# Patient Record
Sex: Female | Born: 1990 | Race: White | Hispanic: No | Smoking: Current every day smoker
Health system: Southern US, Community
[De-identification: ages and names within clinical notes are randomized; demographics above are authoritative.]

## PROBLEM LIST (undated history)

## (undated) ENCOUNTER — Emergency Department (HOSPITAL_COMMUNITY): Payer: Self-pay | Source: Home / Self Care

## (undated) DIAGNOSIS — E0590 Thyrotoxicosis, unspecified without thyrotoxic crisis or storm: Secondary | ICD-10-CM

## (undated) DIAGNOSIS — M549 Dorsalgia, unspecified: Secondary | ICD-10-CM

---
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
## 2008-01-11 ENCOUNTER — Emergency Department (HOSPITAL_COMMUNITY): Admission: EM | Admit: 2008-01-11 | Discharge: 2008-01-11 | Payer: Self-pay | Admitting: Emergency Medicine

## 2008-02-29 ENCOUNTER — Emergency Department (HOSPITAL_COMMUNITY): Admission: EM | Admit: 2008-02-29 | Discharge: 2008-02-29 | Payer: Self-pay | Admitting: Emergency Medicine

## 2008-04-02 ENCOUNTER — Ambulatory Visit (HOSPITAL_COMMUNITY): Admission: RE | Admit: 2008-04-02 | Discharge: 2008-04-02 | Payer: Self-pay | Admitting: *Deleted

## 2008-05-04 ENCOUNTER — Emergency Department (HOSPITAL_COMMUNITY): Admission: EM | Admit: 2008-05-04 | Discharge: 2008-05-04 | Payer: Self-pay | Admitting: Emergency Medicine

## 2008-05-06 ENCOUNTER — Emergency Department (HOSPITAL_COMMUNITY): Admission: EM | Admit: 2008-05-06 | Discharge: 2008-05-06 | Payer: Self-pay | Admitting: Emergency Medicine

## 2008-11-21 ENCOUNTER — Emergency Department (HOSPITAL_COMMUNITY): Admission: EM | Admit: 2008-11-21 | Discharge: 2008-11-21 | Payer: Self-pay | Admitting: Emergency Medicine

## 2010-06-20 ENCOUNTER — Encounter: Payer: Self-pay | Admitting: *Deleted

## 2010-07-04 ENCOUNTER — Emergency Department (HOSPITAL_COMMUNITY): Payer: Self-pay

## 2010-07-04 ENCOUNTER — Emergency Department (HOSPITAL_COMMUNITY)
Admission: EM | Admit: 2010-07-04 | Discharge: 2010-07-04 | Disposition: A | Discharge status: Home / Self Care | Payer: Self-pay | Attending: Emergency Medicine | Admitting: Emergency Medicine

## 2010-07-04 DIAGNOSIS — R079 Chest pain, unspecified: Secondary | ICD-10-CM | POA: Insufficient documentation

## 2010-07-04 DIAGNOSIS — R070 Pain in throat: Secondary | ICD-10-CM | POA: Insufficient documentation

## 2010-07-04 DIAGNOSIS — J020 Streptococcal pharyngitis: Secondary | ICD-10-CM | POA: Insufficient documentation

## 2010-07-04 LAB — RAPID STREP SCREEN (MED CTR MEBANE ONLY): Streptococcus, Group A Screen (Direct): POSITIVE — AB

## 2011-01-10 ENCOUNTER — Other Ambulatory Visit: Payer: Self-pay | Admitting: Obstetrics and Gynecology

## 2011-01-10 ENCOUNTER — Other Ambulatory Visit (HOSPITAL_COMMUNITY)
Admission: RE | Admit: 2011-01-10 | Discharge: 2011-01-10 | Disposition: A | Discharge status: Home / Self Care | Payer: Medicaid Other | Source: Ambulatory Visit | Attending: Obstetrics and Gynecology | Admitting: Obstetrics and Gynecology

## 2011-01-10 DIAGNOSIS — Z113 Encounter for screening for infections with a predominantly sexual mode of transmission: Secondary | ICD-10-CM | POA: Insufficient documentation

## 2011-01-10 DIAGNOSIS — Z01419 Encounter for gynecological examination (general) (routine) without abnormal findings: Secondary | ICD-10-CM | POA: Insufficient documentation

## 2011-01-10 LAB — HEPATITIS B SURFACE ANTIGEN: Hepatitis B Surface Ag: NEGATIVE

## 2011-01-10 LAB — ABO/RH: RH Type: POSITIVE

## 2011-01-10 LAB — ANTIBODY SCREEN: Antibody Screen: NEGATIVE

## 2011-01-10 LAB — RPR: RPR: NONREACTIVE

## 2011-01-10 LAB — HIV ANTIBODY (ROUTINE TESTING W REFLEX): HIV: NONREACTIVE

## 2011-02-27 LAB — URINALYSIS, ROUTINE W REFLEX MICROSCOPIC
Bilirubin Urine: NEGATIVE
Glucose, UA: NEGATIVE
Ketones, ur: NEGATIVE
Nitrite: POSITIVE — AB
Protein, ur: 30 — AB
Specific Gravity, Urine: 1.02
Urobilinogen, UA: 0.2
pH: 6

## 2011-02-27 LAB — URINE MICROSCOPIC-ADD ON

## 2011-02-27 LAB — PREGNANCY, URINE: Preg Test, Ur: NEGATIVE

## 2011-03-03 LAB — STREP A DNA PROBE: Group A Strep Probe: NEGATIVE

## 2011-03-03 LAB — RAPID STREP SCREEN (MED CTR MEBANE ONLY): Streptococcus, Group A Screen (Direct): NEGATIVE

## 2011-03-09 LAB — RAPID STREP SCREEN (MED CTR MEBANE ONLY): Streptococcus, Group A Screen (Direct): NEGATIVE

## 2011-03-09 LAB — STREP A DNA PROBE: Group A Strep Probe: NEGATIVE

## 2011-07-24 ENCOUNTER — Encounter (HOSPITAL_COMMUNITY): Payer: Self-pay

## 2011-07-28 ENCOUNTER — Encounter (HOSPITAL_COMMUNITY)
Admission: RE | Admit: 2011-07-28 | Discharge: 2011-07-28 | Disposition: A | Discharge status: Home / Self Care | Payer: Medicaid Other | Source: Ambulatory Visit | Attending: Obstetrics & Gynecology | Admitting: Obstetrics & Gynecology

## 2011-07-28 ENCOUNTER — Encounter (HOSPITAL_COMMUNITY): Payer: Self-pay

## 2011-07-28 LAB — CBC
HCT: 40.5 % (ref 36.0–46.0)
Hemoglobin: 14.3 g/dL (ref 12.0–15.0)
MCH: 31.2 pg (ref 26.0–34.0)
MCHC: 35.3 g/dL (ref 30.0–36.0)
MCV: 88.2 fL (ref 78.0–100.0)
Platelets: 185 10*3/uL (ref 150–400)
RBC: 4.59 MIL/uL (ref 3.87–5.11)
RDW: 13.6 % (ref 11.5–15.5)
WBC: 16.7 10*3/uL — ABNORMAL HIGH (ref 4.0–10.5)

## 2011-07-28 LAB — SURGICAL PCR SCREEN
MRSA, PCR: NEGATIVE
Staphylococcus aureus: NEGATIVE

## 2011-07-28 NOTE — Patient Instructions (Signed)
YOUR PROCEDURE IS SCHEDULED ON:08/02/11  ENTER THROUGH THE MAIN ENTRANCE OF [facility] AT: 3pm  USE DESK PHONE AND DIAL [phone] TO INFORM us OF YOUR ARRIVAL  CALL [phone] IF YOU HAVE ANY QUESTIONS OR PROBLEMS PRIOR TO YOUR ARRIVAL.  REMEMBER: DO NOT EAT  : after 8:30 am on Wed  SPECIAL INSTRUCTIONS:clear liquids until 2pm on Wed   YOU MAY BRUSH YOUR TEETH THE MORNING OF SURGERY   TAKE THESE MEDICINES THE DAY OF SURGERY WITH SIP OF WATER:none   DO NOT WEAR JEWELRY, EYE MAKEUP, LIPSTICK OR DARK FINGERNAIL POLISH DO NOT WEAR LOTIONS  DO NOT SHAVE FOR 48 HOURS PRIOR TO SURGERY  YOU WILL NOT BE ALLOWED TO DRIVE YOURSELF HOME.  NAME OF DRIVER:Jason - father of baby

## 2011-07-29 LAB — RPR: RPR Ser Ql: NONREACTIVE

## 2011-08-01 ENCOUNTER — Other Ambulatory Visit: Payer: Self-pay | Admitting: Obstetrics & Gynecology

## 2011-08-02 ENCOUNTER — Inpatient Hospital Stay (HOSPITAL_COMMUNITY)
Admission: RE | Admit: 2011-08-02 | Discharge: 2011-08-04 | DRG: 765 | Disposition: A | Discharge status: Home Health | Payer: Medicaid Other | Source: Ambulatory Visit | Attending: Obstetrics & Gynecology | Admitting: Obstetrics & Gynecology

## 2011-08-02 ENCOUNTER — Encounter (HOSPITAL_COMMUNITY)
Admission: RE | Disposition: A | Discharge status: Home Health | Payer: Self-pay | Source: Ambulatory Visit | Attending: Obstetrics & Gynecology

## 2011-08-02 ENCOUNTER — Inpatient Hospital Stay (HOSPITAL_COMMUNITY): Payer: Medicaid Other

## 2011-08-02 ENCOUNTER — Encounter (HOSPITAL_COMMUNITY): Payer: Self-pay

## 2011-08-02 ENCOUNTER — Encounter (HOSPITAL_COMMUNITY): Payer: Self-pay | Admitting: *Deleted

## 2011-08-02 DIAGNOSIS — O4100X0 Oligohydramnios, unspecified trimester, not applicable or unspecified: Secondary | ICD-10-CM | POA: Diagnosis present

## 2011-08-02 DIAGNOSIS — O365990 Maternal care for other known or suspected poor fetal growth, unspecified trimester, not applicable or unspecified: Secondary | ICD-10-CM | POA: Diagnosis present

## 2011-08-02 DIAGNOSIS — O321XX0 Maternal care for breech presentation, not applicable or unspecified: Principal | ICD-10-CM | POA: Diagnosis present

## 2011-08-02 LAB — COMPREHENSIVE METABOLIC PANEL
ALT: 16 U/L (ref 0–35)
AST: 14 U/L (ref 0–37)
Albumin: 3.1 g/dL — ABNORMAL LOW (ref 3.5–5.2)
Alkaline Phosphatase: 120 U/L — ABNORMAL HIGH (ref 39–117)
BUN: 6 mg/dL (ref 6–23)
CO2: 23 mEq/L (ref 19–32)
Calcium: 9.7 mg/dL (ref 8.4–10.5)
Chloride: 100 mEq/L (ref 96–112)
Creatinine, Ser: 0.71 mg/dL (ref 0.50–1.10)
GFR calc Af Amer: >90 mL/min (ref >90)
GFR calc non Af Amer: >90 mL/min (ref >90)
Glucose, Bld: 73 mg/dL (ref 70–99)
Potassium: 3.9 mEq/L (ref 3.5–5.1)
Sodium: 134 mEq/L — ABNORMAL LOW (ref 135–145)
Total Bilirubin: 0.5 mg/dL (ref 0.3–1.2)
Total Protein: 6.8 g/dL (ref 6.0–8.3)

## 2011-08-02 LAB — URINALYSIS, ROUTINE W REFLEX MICROSCOPIC
Bilirubin Urine: NEGATIVE
Glucose, UA: NEGATIVE mg/dL
Hgb urine dipstick: NEGATIVE
Ketones, ur: 15 mg/dL — AB
Nitrite: NEGATIVE
Protein, ur: NEGATIVE mg/dL
Specific Gravity, Urine: 1.015 (ref 1.005–1.030)
Urobilinogen, UA: 0.2 mg/dL (ref 0.0–1.0)
pH: 7 (ref 5.0–8.0)

## 2011-08-02 LAB — CBC
HCT: 40.7 % (ref 36.0–46.0)
Hemoglobin: 14.2 g/dL (ref 12.0–15.0)
MCH: 30.8 pg (ref 26.0–34.0)
MCHC: 34.9 g/dL (ref 30.0–36.0)
MCV: 88.3 fL (ref 78.0–100.0)
Platelets: 190 10*3/uL (ref 150–400)
RBC: 4.61 MIL/uL (ref 3.87–5.11)
RDW: 13.7 % (ref 11.5–15.5)
WBC: 15.1 10*3/uL — ABNORMAL HIGH (ref 4.0–10.5)

## 2011-08-02 LAB — URINE MICROSCOPIC-ADD ON

## 2011-08-02 SURGERY — Surgical Case
Anesthesia: Spinal | Site: Abdomen | Wound class: Clean Contaminated

## 2011-08-02 MED ORDER — ONDANSETRON HCL 4 MG/2ML IJ SOLN: 4.0000 mg | INTRAMUSCULAR | Status: DC | PRN

## 2011-08-02 MED ORDER — DIBUCAINE 1 % RE OINT: 1.0000 "application " | TOPICAL_OINTMENT | RECTAL | Status: DC | PRN

## 2011-08-02 MED ORDER — CEFAZOLIN SODIUM 1-5 GM-% IV SOLN: 1.0000 g | INTRAVENOUS | Status: DC

## 2011-08-02 MED ORDER — MENTHOL 3 MG MT LOZG: 1.0000 | LOZENGE | OROMUCOSAL | Status: DC | PRN

## 2011-08-02 MED ORDER — NALBUPHINE HCL 10 MG/ML IJ SOLN
5.0000 mg | INTRAMUSCULAR | Status: DC | PRN
  Filled 2011-08-02: qty 1

## 2011-08-02 MED ORDER — SODIUM CHLORIDE 0.9 % IJ SOLN: 3.0000 mL | INTRAMUSCULAR | Status: DC | PRN

## 2011-08-02 MED ORDER — IBUPROFEN 600 MG PO TABS
600.0000 mg | ORAL_TABLET | Freq: Four times a day (QID) | ORAL | Status: DC
  Administered 2011-08-03 – 2011-08-04 (×6): 600 mg via ORAL
  Filled 2011-08-02 (×6): qty 1

## 2011-08-02 MED ORDER — FENTANYL CITRATE 0.05 MG/ML IJ SOLN: 25.0000 ug | INTRAMUSCULAR | Status: DC | PRN

## 2011-08-02 MED ORDER — FENTANYL CITRATE 0.05 MG/ML IJ SOLN
INTRAMUSCULAR | Status: DC | PRN
  Administered 2011-08-02: 15 ug via EPIDURAL

## 2011-08-02 MED ORDER — DIPHENHYDRAMINE HCL 25 MG PO CAPS: 25.0000 mg | ORAL_CAPSULE | ORAL | Status: DC | PRN

## 2011-08-02 MED ORDER — SCOPOLAMINE 1 MG/3DAYS TD PT72: 1.0000 | MEDICATED_PATCH | Freq: Once | TRANSDERMAL | Status: DC

## 2011-08-02 MED ORDER — SODIUM CHLORIDE 0.9 % IV SOLN: 1.0000 ug/kg/h | INTRAVENOUS | Status: DC | PRN

## 2011-08-02 MED ORDER — KETOROLAC TROMETHAMINE 30 MG/ML IJ SOLN: 30.0000 mg | Freq: Four times a day (QID) | INTRAMUSCULAR | Status: AC | PRN

## 2011-08-02 MED ORDER — EPHEDRINE SULFATE 50 MG/ML IJ SOLN
INTRAMUSCULAR | Status: DC | PRN
  Administered 2011-08-02: 5 mg via INTRAVENOUS
  Administered 2011-08-02: 10 mg via INTRAVENOUS
  Administered 2011-08-02: 5 mg via INTRAVENOUS

## 2011-08-02 MED ORDER — DIPHENHYDRAMINE HCL 50 MG/ML IJ SOLN: 25.0000 mg | INTRAMUSCULAR | Status: DC | PRN

## 2011-08-02 MED ORDER — ONDANSETRON HCL 4 MG PO TABS: 4.0000 mg | ORAL_TABLET | ORAL | Status: DC | PRN

## 2011-08-02 MED ORDER — MEPERIDINE HCL 25 MG/ML IJ SOLN: 6.2500 mg | INTRAMUSCULAR | Status: DC | PRN

## 2011-08-02 MED ORDER — SIMETHICONE 80 MG PO CHEW: 80.0000 mg | CHEWABLE_TABLET | ORAL | Status: DC | PRN

## 2011-08-02 MED ORDER — LANOLIN HYDROUS EX OINT: 1.0000 "application " | TOPICAL_OINTMENT | CUTANEOUS | Status: DC | PRN

## 2011-08-02 MED ORDER — OXYTOCIN 10 UNIT/ML IJ SOLN
INTRAMUSCULAR | Status: DC | PRN
  Administered 2011-08-02: 20 [IU]

## 2011-08-02 MED ORDER — WITCH HAZEL-GLYCERIN EX PADS: 1.0000 "application " | MEDICATED_PAD | CUTANEOUS | Status: DC | PRN

## 2011-08-02 MED ORDER — ZOLPIDEM TARTRATE 5 MG PO TABS: 5.0000 mg | ORAL_TABLET | Freq: Every evening | ORAL | Status: DC | PRN

## 2011-08-02 MED ORDER — MORPHINE SULFATE (PF) 0.5 MG/ML IJ SOLN
INTRAMUSCULAR | Status: DC | PRN
  Administered 2011-08-02: .1 mg via INTRATHECAL

## 2011-08-02 MED ORDER — ONDANSETRON HCL 4 MG/2ML IJ SOLN: 4.0000 mg | Freq: Three times a day (TID) | INTRAMUSCULAR | Status: DC | PRN

## 2011-08-02 MED ORDER — MORPHINE SULFATE 0.5 MG/ML IJ SOLN
INTRAMUSCULAR | Status: AC
  Filled 2011-08-02: qty 10

## 2011-08-02 MED ORDER — OXYTOCIN 20 UNITS IN LACTATED RINGERS INFUSION - SIMPLE
125.0000 mL/h | INTRAVENOUS | Status: AC
  Administered 2011-08-02: 125 mL/h via INTRAVENOUS

## 2011-08-02 MED ORDER — OXYTOCIN 20 UNITS IN LACTATED RINGERS INFUSION - SIMPLE
INTRAVENOUS | Status: AC
  Filled 2011-08-02: qty 1000

## 2011-08-02 MED ORDER — DIPHENHYDRAMINE HCL 25 MG PO CAPS: 25.0000 mg | ORAL_CAPSULE | Freq: Four times a day (QID) | ORAL | Status: DC | PRN

## 2011-08-02 MED ORDER — SENNOSIDES-DOCUSATE SODIUM 8.6-50 MG PO TABS
2.0000 | ORAL_TABLET | Freq: Every day | ORAL | Status: DC
  Administered 2011-08-03: 2 via ORAL

## 2011-08-02 MED ORDER — KETOROLAC TROMETHAMINE 60 MG/2ML IM SOLN
60.0000 mg | Freq: Once | INTRAMUSCULAR | Status: AC | PRN
  Administered 2011-08-02: 60 mg via INTRAMUSCULAR

## 2011-08-02 MED ORDER — PRENATAL MULTIVITAMIN CH
1.0000 | ORAL_TABLET | Freq: Every day | ORAL | Status: DC
  Administered 2011-08-03 – 2011-08-04 (×2): 1 via ORAL
  Filled 2011-08-02 (×2): qty 1

## 2011-08-02 MED ORDER — FENTANYL CITRATE 0.05 MG/ML IJ SOLN
INTRAMUSCULAR | Status: AC
  Filled 2011-08-02: qty 2

## 2011-08-02 MED ORDER — LACTATED RINGERS IV SOLN
INTRAVENOUS | Status: DC
  Administered 2011-08-03: 06:00:00 via INTRAVENOUS

## 2011-08-02 MED ORDER — BUPIVACAINE IN DEXTROSE 0.75-8.25 % IT SOLN
INTRATHECAL | Status: DC | PRN
  Administered 2011-08-02: 1.5 mL via INTRATHECAL

## 2011-08-02 MED ORDER — ONDANSETRON HCL 4 MG/2ML IJ SOLN
INTRAMUSCULAR | Status: DC | PRN
  Administered 2011-08-02: 4 mg via INTRAVENOUS

## 2011-08-02 MED ORDER — METOCLOPRAMIDE HCL 5 MG/ML IJ SOLN: 10.0000 mg | Freq: Three times a day (TID) | INTRAMUSCULAR | Status: DC | PRN

## 2011-08-02 MED ORDER — KETOROLAC TROMETHAMINE 60 MG/2ML IM SOLN
INTRAMUSCULAR | Status: AC
  Filled 2011-08-02: qty 2

## 2011-08-02 MED ORDER — SCOPOLAMINE 1 MG/3DAYS TD PT72
1.0000 | MEDICATED_PATCH | Freq: Once | TRANSDERMAL | Status: DC
  Administered 2011-08-02: 1.5 mg via TRANSDERMAL

## 2011-08-02 MED ORDER — EPHEDRINE 5 MG/ML INJ
INTRAVENOUS | Status: AC
  Filled 2011-08-02: qty 10

## 2011-08-02 MED ORDER — DIPHENHYDRAMINE HCL 50 MG/ML IJ SOLN: 12.5000 mg | INTRAMUSCULAR | Status: DC | PRN

## 2011-08-02 MED ORDER — OXYTOCIN 10 UNIT/ML IJ SOLN
INTRAMUSCULAR | Status: AC
  Filled 2011-08-02: qty 2

## 2011-08-02 MED ORDER — OXYCODONE-ACETAMINOPHEN 5-325 MG PO TABS
1.0000 | ORAL_TABLET | ORAL | Status: DC | PRN
  Administered 2011-08-03 – 2011-08-04 (×5): 1 via ORAL
  Filled 2011-08-02 (×5): qty 1

## 2011-08-02 MED ORDER — SIMETHICONE 80 MG PO CHEW
80.0000 mg | CHEWABLE_TABLET | Freq: Three times a day (TID) | ORAL | Status: DC
  Administered 2011-08-03 – 2011-08-04 (×5): 80 mg via ORAL

## 2011-08-02 MED ORDER — TETANUS-DIPHTH-ACELL PERTUSSIS 5-2.5-18.5 LF-MCG/0.5 IM SUSP
0.5000 mL | Freq: Once | INTRAMUSCULAR | Status: AC
  Administered 2011-08-03: 0.5 mL via INTRAMUSCULAR
  Filled 2011-08-02: qty 0.5

## 2011-08-02 MED ORDER — LACTATED RINGERS IV SOLN
INTRAVENOUS | Status: DC
  Administered 2011-08-02 (×3): via INTRAVENOUS

## 2011-08-02 MED ORDER — IBUPROFEN 600 MG PO TABS: 600.0000 mg | ORAL_TABLET | Freq: Four times a day (QID) | ORAL | Status: DC | PRN

## 2011-08-02 MED ORDER — CEFAZOLIN SODIUM 1-5 GM-% IV SOLN
INTRAVENOUS | Status: AC
  Administered 2011-08-02: 1 g via INTRAVENOUS
  Filled 2011-08-02: qty 50

## 2011-08-02 MED ORDER — ONDANSETRON HCL 4 MG/2ML IJ SOLN
INTRAMUSCULAR | Status: AC
  Filled 2011-08-02: qty 2

## 2011-08-02 MED ORDER — SCOPOLAMINE 1 MG/3DAYS TD PT72
MEDICATED_PATCH | TRANSDERMAL | Status: AC
  Administered 2011-08-02: 1.5 mg via TRANSDERMAL
  Filled 2011-08-02: qty 1

## 2011-08-02 MED ORDER — NALOXONE HCL 0.4 MG/ML IJ SOLN: 0.4000 mg | INTRAMUSCULAR | Status: DC | PRN

## 2011-08-02 SURGICAL SUPPLY — 29 items
CLOTH BEACON ORANGE TIMEOUT ST (SAFETY) ×2 IMPLANT
DERMABOND ADVANCED (GAUZE/BANDAGES/DRESSINGS) ×2
DERMABOND ADVANCED .7 DNX12 (GAUZE/BANDAGES/DRESSINGS) ×2 IMPLANT
DURAPREP 26ML APPLICATOR (WOUND CARE) ×4 IMPLANT
ELECT REM PT RETURN 9FT ADLT (ELECTROSURGICAL) ×2
ELECTRODE REM PT RTRN 9FT ADLT (ELECTROSURGICAL) ×1 IMPLANT
EXTRACTOR VACUUM BELL STYLE (SUCTIONS) IMPLANT
GLOVE BIOGEL PI IND STRL 8 (GLOVE) ×2 IMPLANT
GLOVE BIOGEL PI INDICATOR 8 (GLOVE) ×2
GLOVE ECLIPSE 8.0 STRL XLNG CF (GLOVE) ×2 IMPLANT
KIT ABG SYR 3ML LUER SLIP (SYRINGE) ×2 IMPLANT
NEEDLE HYPO 25X5/8 SAFETYGLIDE (NEEDLE) ×2 IMPLANT
NS IRRIG 1000ML POUR BTL (IV SOLUTION) ×2 IMPLANT
PACK C SECTION WH (CUSTOM PROCEDURE TRAY) ×2 IMPLANT
RTRCTR C-SECT PINK 25CM LRG (MISCELLANEOUS) IMPLANT
SLEEVE SCD COMPRESS KNEE MED (MISCELLANEOUS) IMPLANT
STAPLER VISISTAT 35W (STAPLE) IMPLANT
SUT CHROMIC 0 CT 1 (SUTURE) ×2 IMPLANT
SUT MNCRL 0 VIOLET CTX 36 (SUTURE) ×3 IMPLANT
SUT MONOCRYL 0 CTX 36 (SUTURE) ×3
SUT PLAIN 2 0 (SUTURE)
SUT PLAIN 2 0 XLH (SUTURE) IMPLANT
SUT PLAIN ABS 2-0 CT1 27XMFL (SUTURE) IMPLANT
SUT VIC AB 0 CTX 36 (SUTURE) ×1
SUT VIC AB 0 CTX36XBRD ANBCTRL (SUTURE) ×1 IMPLANT
SUT VIC AB 4-0 KS 27 (SUTURE) ×2 IMPLANT
TOWEL OR 17X24 6PK STRL BLUE (TOWEL DISPOSABLE) ×4 IMPLANT
TRAY FOLEY CATH 14FR (SET/KITS/TRAYS/PACK) IMPLANT
WATER STERILE IRR 1000ML POUR (IV SOLUTION) IMPLANT
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
## 2011-08-02 NOTE — Progress Notes (Signed)
Pt. Transferred to PACU after prepared for OR in Short Stay.  Dr, Rodman Pickle called and made aware.

## 2011-08-02 NOTE — Anesthesia Procedure Notes (Signed)
Spinal  Patient location during procedure: OR Start time: 08/02/2011 6:03 PM Staffing Performed by: anesthesiologist  Preanesthetic Checklist Completed: patient identified, site marked, surgical consent, pre-op evaluation, timeout performed, IV checked, risks and benefits discussed and monitors and equipment checked Spinal Block Patient position: sitting Prep: site prepped and draped and DuraPrep Patient monitoring: heart rate, cardiac monitor, continuous pulse ox and blood pressure Approach: midline Location: L3-4 Injection technique: single-shot Needle Needle type: Sprotte  Needle gauge: 24 G Needle length: 9 cm Assessment Sensory level: T4 Additional Notes Clear free flow CSF on first attempt.  No paresthesia.  Patient tolerated procedure well.  Jasmine December, MD

## 2011-08-02 NOTE — Op Note (Signed)
Preoperative diagnosis:  1.  Intrauterine pregnancy at 63 1/[redacted] weeks gestation                                         2.  Homero Fellers Breech                                          3.  IUGR with borderline oligohydramnios   Postoperative diagnosis:  Same as above   Procedure:  Primary cesarean section  Surgeon:  Lazaro Arms MD  Assistant:  none  Anesthesia: Spinal  Findings:  Patient found to be breech about 2 weeks ago, but hade 7% growth and AFI of 7.5.  I elected not to offer version in this clinical scenario.  Actually tonight at surgery there was very little fluid at all    Over a low transverse incision was delivered a viable female with Apgars of 8 and 9 weighing pending  lbs.  oz. Uterus, tubes and ovaries were all normal.  There were no other significant findings.  Baby was in the frank breech presentation.  Description of operation:  Patient was taken to the operating room and placed in the sitting position where she underwent a spinal anesthetic. She was then placed in the supine position with tilt to the left side. When adequate anesthetic level was obtained she was prepped and draped in usual sterile fashion and a Foley catheter was placed. A Pfannenstiel skin incision was made and carried down sharply to the rectus fascia which was scored in the midline extended laterally. The fascia was taken off the muscles both superiorly and without difficulty. The muscles were divided.  The peritoneal cavity was entered.  Bladder blade was placed, no bladder flap was created.  A low transverse hysterotomy incision was made and delivered a viable female  infant at 59 with Apgars of 8 and 9 weighing pending  lbs  oz.  Cord pH was obtained and was 7.31. The uterus was exteriorized. It was closed in 2 layers, the first being a running interlocking layer and the second being an imbricating layer using 0 monocryl on a CTX needle. There was good resulting hemostasis. The uterus tubes and ovaries were all  normal. Peritoneal cavity was irrigated vigorously. The muscles and peritoneum were reapproximated loosely. The fascia was closed using 0 Vicryl in running fashion. Subcutaneous tissue was made hemostatic and irrigated. The skin was closed using 4-0 Vicryl on a Keith needle in a subcuticular fashion.  Dermabond was placed for additional wound integrity and to serve as a barrier. Blood loss for the procedure was 700 cc. The patient received a gram of Ancef prophylactically. The patient was taken to the recovery room in good stable condition with all counts being correct x3.  EBL 700 cc  Gaige Fussner H 08/02/2011 6:43 PM  Annisha Baar H 08/02/2011 6:39 PM
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
## 2011-08-02 NOTE — Transfer of Care (Signed)
Immediate Anesthesia Transfer of Care Note  Patient: Rhonda Macias  Procedure(s) Performed: Procedure(s) (LRB): CESAREAN SECTION (N/A)  Patient Location: PACU  Anesthesia Type: Spinal  Level of Consciousness: alert  and oriented  Airway & Oxygen Therapy: Patient Spontanous Breathing  Post-op Assessment: Report given to PACU RN and Post -op Vital signs reviewed and stable  Post vital signs: stable  Complications: No apparent anesthesia complications

## 2011-08-02 NOTE — Anesthesia Postprocedure Evaluation (Signed)
  Anesthesia Post-op Note  Patient: Rhonda Macias  Procedure(s) Performed: Procedure(s) (LRB): CESAREAN SECTION (N/A)  Patient Location: PACU  Anesthesia Type: Spinal  Level of Consciousness: awake, alert  and oriented  Airway and Oxygen Therapy: Patient Spontanous Breathing  Post-op Pain: none  Post-op Assessment: Post-op Vital signs reviewed, Patient's Cardiovascular Status Stable, Respiratory Function Stable, Patent Airway, No signs of Nausea or vomiting, Pain level controlled, No headache, No backache, No residual numbness and No residual motor weakness  Post-op Vital Signs: Reviewed  Complications: No apparent anesthesia complications

## 2011-08-03 ENCOUNTER — Encounter (HOSPITAL_COMMUNITY): Payer: Self-pay | Admitting: Obstetrics & Gynecology

## 2011-08-03 LAB — CBC
HCT: 29.5 % — ABNORMAL LOW (ref 36.0–46.0)
Hemoglobin: 10.1 g/dL — ABNORMAL LOW (ref 12.0–15.0)
MCH: 30.3 pg (ref 26.0–34.0)
MCHC: 34.2 g/dL (ref 30.0–36.0)
MCV: 88.6 fL (ref 78.0–100.0)
Platelets: 131 10*3/uL — ABNORMAL LOW (ref 150–400)
RBC: 3.33 MIL/uL — ABNORMAL LOW (ref 3.87–5.11)
RDW: 13.7 % (ref 11.5–15.5)
WBC: 14.2 10*3/uL — ABNORMAL HIGH (ref 4.0–10.5)

## 2011-08-03 MED ORDER — LACTATED RINGERS IV BOLUS (SEPSIS)
500.0000 mL | Freq: Once | INTRAVENOUS | Status: AC
  Administered 2011-08-03: 500 mL via INTRAVENOUS

## 2011-08-03 MED ORDER — LACTATED RINGERS IV BOLUS (SEPSIS)
500.0000 mL | Freq: Once | INTRAVENOUS | Status: AC
  Administered 2011-08-03: 1000 mL via INTRAVENOUS

## 2011-08-03 NOTE — Addendum Note (Signed)
Addendum  created 08/03/11 0817 by Cephus Shelling, CRNA   Modules edited:Notes Section

## 2011-08-03 NOTE — Progress Notes (Signed)
Subjective: Postpartum Day 1: Cesarean Delivery Patient reports incisional pain and tolerating PO.    Objective: Vital signs in last 24 hours: Temp:  [97.6 F (36.4 C)-98.5 F (36.9 C)] 98.5 F (36.9 C) (03/07 0550) Pulse Rate:  [6-62] 52  (03/07 0550) Resp:  [14-33] 18  (03/07 0550) BP: (90-120)/(48-78) 90/48 mmHg (03/07 0550) SpO2:  [98 %-100 %] 99 % (03/07 0550) Weight:  [162 lb (73.483 kg)] 162 lb (73.483 kg) (03/06 2100)  Physical Exam:  General: alert, cooperative and no distress Lochia: appropriate Uterine Fundus: firm Incision: healing well, no significant drainage, no dehiscence, no significant erythema DVT Evaluation: No evidence of DVT seen on physical exam.   Basename 08/03/11 0530 08/02/11 1510  HGB 10.1* 14.2  HCT 29.5* 40.7    Assessment/Plan: Status post Cesarean section. Doing well postoperatively.  Continue current care.  EURE,LUTHER H 08/03/2011, 7:32 AM

## 2011-08-03 NOTE — Anesthesia Postprocedure Evaluation (Signed)
  Anesthesia Post-op Note  Patient: Rhonda Macias  Procedure(s) Performed: Procedure(s) (LRB): CESAREAN SECTION (N/A)  Patient Location: Mother/Baby  Anesthesia Type: Spinal  Level of Consciousness: awake  Airway and Oxygen Therapy: Patient Spontanous Breathing  Post-op Pain: mild  Post-op Assessment: Post-op Vital signs reviewed  Post-op Vital Signs: Reviewed and stable  Complications: No apparent anesthesia complications

## 2011-08-03 NOTE — Progress Notes (Signed)
UR chart review completed.  

## 2011-08-04 MED ORDER — IBUPROFEN 600 MG PO TABS: 600.0000 mg | ORAL_TABLET | Freq: Four times a day (QID) | ORAL | Status: AC

## 2011-08-04 MED ORDER — OXYCODONE-ACETAMINOPHEN 5-325 MG PO TABS: 1.0000 | ORAL_TABLET | ORAL | Status: AC | PRN

## 2011-08-04 NOTE — Progress Notes (Signed)
Patient seen by me also.  There is a 3-4cm round area of ecchymosis between umbilicus and incision as well as a large area of ecchymosis inferior to incision.  No active bleeding, but incision does ooze serosanguinous fluid. I reassured her this is sometimes seen with surgery (she was told it was hard to get baby out) and to watch for increased drainage or opening of incision. She will go to [facility]. Has appt next week

## 2011-08-04 NOTE — Progress Notes (Signed)
PSYCHOSOCIAL ASSESSMENT ~ MATERNAL/CHILD Name: Rhonda Macias                                                                            Age: 21  Referral Date:       08/04/11 Reason/Source: Substance use/ CN  I. FAMILY/HOME ENVIRONMENT A. Child's Legal Guardian __X_Parent(s) ___Grandparent ___Foster parent ___DSS_________________ Name:  Rhonda Macias                               DOB: //                     Age: 39  Address: [address] [place], [place] [address]  Name:   Rhonda Macias                                          DOB: //                     Age: 4  Address: (same as above)  B. Other Household Members/Support Persons Name:                                         Relationship:                        DOB ___/___/___                   Name:                                         Relationship:                        DOB ___/___/___                   Name:                                         Relationship:                        DOB ___/___/___                   Name:                                         Relationship:                        DOB ___/___/___  C. Other Support:   II. PSYCHOSOCIAL DATA A. Information Source  _X_Patient Interview  __Family Interview           __Other___________  B. Surveyor, quantity and [employer] __Employment: _X_Medicaid    [place]: [employer]                __Private Insurance:                   __Self Pay  __Food Stamps   _X_WIC __[employer]     __Public Housing     __Section 8    __Maternity Care Coordination/Child Service Coordination/Early Intervention   ___School:                                                                         Grade:  __Other:   [name] Cultural and Environment Information Cultural Issues Impacting Care:  III. STRENGTHS _X__Supportive family/friends _X__Adequate Resources ___Compliance with medical plan _X__Home prepared  for Child (including basic supplies) ___Understanding of illness      ___Other: RISK FACTORS AND CURRENT PROBLEMS         ____No Problems Noted                                                                                                                                                                                                                                                     IV. SOCIAL WORK ASSESSMENT   Pt admits to smoking a MJ joint, "4 times a week," prior to pregnancy confirmation at 2-3 months.  Once pregnancy was confirmed, she decreased use to 2 times a week.  Pt told Sw that the MJ helped with her sickness, as she was nauseous and could not gain an appetite majority of the pregnancy.  Pt last smoked one month ago.  She denies other illegal substance use and verbalized understanding of hospital drug testing policy.  UDS is negative, meconium is pending.  Pt and FOB both smoke cigarettes and reassured Sw that they would not smoke in the house or around the infant.  She reports having all the necessary supplies for the infant but expressed a need  for formula and diapers.  She assisted the pt with scheduling a WIC appointment.  FOB was at the bedside, aware of pt history and supportive.  Sw will follow up with meconium results and make a referral if needed.     V. SOCIAL WORK PLAN  _X__No Further Intervention Required/No Barriers to Discharge   ___Psychosocial Support and Ongoing Assessment of Needs   ___Patient/Family Education:   ___Child Protective Services Report   County___________ Date___/____/____   ___Information/Referral to [employer]_________________________   ___Other:

## 2011-08-04 NOTE — Discharge Summary (Signed)
Obstetric Discharge Summary  Rhonda Macias is a 21 yo G2P1011 at PPD/POD #2 after PLTCS doing well.  Reason for Admission: cesarean section and IUGR, oligohydraminos, and breech presentaion  Prenatal Procedures: NST and ultrasound Intrapartum Procedures: cesarean: low cervical, transverse Postpartum Procedures: antibiotics Complications-Operative and Postpartum: none Hemoglobin  Date Value Range Status  08/03/2011 10.1* 12.0-15.0 (g/dL) Final     REPEATED TO VERIFY     DELTA CHECK NOTED     HCT  Date Value Range Status  08/03/2011 29.5* 36.0-46.0 (%) Final    Discharge Diagnoses: Term Pregnancy-delivered  Discharge Information: Date: 08/04/2011 Activity: pelvic rest and no lifting more than 10lbs for 6 wks Diet: routine Medications: Ibuprofen and Percocet Condition: stable Instructions: refer to practice specific booklet Discharge to: home   Newborn Data: Live born female  Birth Weight: 5 lb 3.3 oz (2360 g) APGAR: 8, 9  Home with mother.  Mardene Speak 08/04/2011, 10:16 AM

## 2011-08-04 NOTE — Progress Notes (Signed)
Subjective: Postpartum Day #2: Cesarean Delivery  Rhonda Macias is a 21 yo G2P1011 at PPD/POD #2 after PLTCS doing well.  Patient reports incisional pain, tolerating PO, + flatus and no problems voiding.  She also complains of bruising and slight leaking of blood around incision site. She has not had a BM at this point. Her pain is well controlled with Ibuprofen and Percocet. She continues to bottle feed.  She is uncertain which option she will use for future family planning. She denies SOB, wheezing, and leg pain.  Objective: Vital signs in last 24 hours: Temp:  [98 F (36.7 C)] 98 F (36.7 C) (03/08 0500) Pulse Rate:  [54-82] 54  (03/08 0500) Resp:  [18-20] 18  (03/08 0500) BP: (95-123)/(59-88) 95/59 mmHg (03/08 0500) SpO2:  [97 %-98 %] 97 % (03/07 2044)  Physical Exam:  General: alert, cooperative, appears stated age and no distress Lochia: appropriate Uterine Fundus: Firm and at umbilicus Incision: healing well, no significant drainage, no dehiscence, inferior ecchymosis noted. DVT Evaluation: No evidence of DVT seen on physical exam.   Basename 08/03/11 0530 08/02/11 1510  HGB 10.1* 14.2  HCT 29.5* 40.7    Assessment Rhonda Macias is a 21 yo G2P1011 at PPD/POD #2 after PLTCS  Plan Status post Cesarean section. Doing well postoperatively.  Ecchymosis most likely normal bleeding under skin from Procedure. Discharge home with standard precautions and return to clinic as scheduled.  Mardene Speak 08/04/2011, 9:52 AM

## 2011-08-08 NOTE — Discharge Summary (Signed)
Patient seen. Agree with note

## 2012-02-25 ENCOUNTER — Emergency Department (HOSPITAL_COMMUNITY): Payer: Self-pay

## 2012-02-25 ENCOUNTER — Emergency Department (HOSPITAL_COMMUNITY)
Admission: EM | Admit: 2012-02-25 | Discharge: 2012-02-25 | Disposition: A | Discharge status: Home / Self Care | Payer: Self-pay | Attending: Emergency Medicine | Admitting: Emergency Medicine

## 2012-02-25 ENCOUNTER — Encounter (HOSPITAL_COMMUNITY): Payer: Self-pay | Admitting: Emergency Medicine

## 2012-02-25 DIAGNOSIS — R51 Headache: Secondary | ICD-10-CM | POA: Insufficient documentation

## 2012-02-25 DIAGNOSIS — M549 Dorsalgia, unspecified: Secondary | ICD-10-CM | POA: Insufficient documentation

## 2012-02-25 DIAGNOSIS — R109 Unspecified abdominal pain: Secondary | ICD-10-CM | POA: Insufficient documentation

## 2012-02-25 LAB — URINALYSIS, ROUTINE W REFLEX MICROSCOPIC
Bilirubin Urine: NEGATIVE
Glucose, UA: NEGATIVE mg/dL
Hgb urine dipstick: NEGATIVE
Ketones, ur: NEGATIVE mg/dL
Nitrite: NEGATIVE
Protein, ur: NEGATIVE mg/dL
Specific Gravity, Urine: 1.02 (ref 1.005–1.030)
Urobilinogen, UA: 0.2 mg/dL (ref 0.0–1.0)
pH: 6 (ref 5.0–8.0)

## 2012-02-25 LAB — COMPREHENSIVE METABOLIC PANEL
ALT: 20 U/L (ref 0–35)
AST: 15 U/L (ref 0–37)
Albumin: 4.1 g/dL (ref 3.5–5.2)
Alkaline Phosphatase: 58 U/L (ref 39–117)
BUN: 6 mg/dL (ref 6–23)
CO2: 27 mEq/L (ref 19–32)
Calcium: 9.5 mg/dL (ref 8.4–10.5)
Chloride: 102 mEq/L (ref 96–112)
Creatinine, Ser: 0.78 mg/dL (ref 0.50–1.10)
GFR calc Af Amer: >90 mL/min (ref >90)
GFR calc non Af Amer: >90 mL/min (ref >90)
Glucose, Bld: 85 mg/dL (ref 70–99)
Potassium: 3.8 mEq/L (ref 3.5–5.1)
Sodium: 138 mEq/L (ref 135–145)
Total Bilirubin: 0.3 mg/dL (ref 0.3–1.2)
Total Protein: 7 g/dL (ref 6.0–8.3)

## 2012-02-25 LAB — CBC WITH DIFFERENTIAL/PLATELET
Basophils Absolute: 0 10*3/uL (ref 0.0–0.1)
Basophils Relative: 1 % (ref 0–1)
Eosinophils Absolute: 0.3 10*3/uL (ref 0.0–0.7)
Eosinophils Relative: 4 % (ref 0–5)
HCT: 35.9 % — ABNORMAL LOW (ref 36.0–46.0)
Hemoglobin: 12.5 g/dL (ref 12.0–15.0)
Lymphocytes Relative: 32 % (ref 12–46)
Lymphs Abs: 2.8 10*3/uL (ref 0.7–4.0)
MCH: 29.7 pg (ref 26.0–34.0)
MCHC: 34.8 g/dL (ref 30.0–36.0)
MCV: 85.3 fL (ref 78.0–100.0)
Monocytes Absolute: 0.5 10*3/uL (ref 0.1–1.0)
Monocytes Relative: 6 % (ref 3–12)
Neutro Abs: 4.9 10*3/uL (ref 1.7–7.7)
Neutrophils Relative %: 57 % (ref 43–77)
Platelets: 218 10*3/uL (ref 150–400)
RBC: 4.21 MIL/uL (ref 3.87–5.11)
RDW: 13 % (ref 11.5–15.5)
WBC: 8.6 10*3/uL (ref 4.0–10.5)

## 2012-02-25 LAB — PREGNANCY, URINE: Preg Test, Ur: NEGATIVE

## 2012-02-25 LAB — URINE MICROSCOPIC-ADD ON

## 2012-02-25 MED ORDER — HYDROCODONE-ACETAMINOPHEN 5-325 MG PO TABS: 1.0000 | ORAL_TABLET | Freq: Four times a day (QID) | ORAL | Status: AC | PRN

## 2012-02-25 MED ORDER — CEPHALEXIN 500 MG PO CAPS: 500.0000 mg | ORAL_CAPSULE | Freq: Four times a day (QID) | ORAL | Status: DC

## 2012-02-25 NOTE — ED Notes (Signed)
Patient verbalized understanding of discharge instructions, follow up care and rx x2.
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
## 2012-04-29 ENCOUNTER — Emergency Department (HOSPITAL_COMMUNITY)
Admission: EM | Admit: 2012-04-29 | Discharge: 2012-04-29 | Disposition: A | Discharge status: Home / Self Care | Payer: Self-pay | Attending: Emergency Medicine | Admitting: Emergency Medicine

## 2012-04-29 ENCOUNTER — Encounter (HOSPITAL_COMMUNITY): Payer: Self-pay | Admitting: *Deleted

## 2012-04-29 DIAGNOSIS — F17200 Nicotine dependence, unspecified, uncomplicated: Secondary | ICD-10-CM | POA: Insufficient documentation

## 2012-04-29 DIAGNOSIS — K0889 Other specified disorders of teeth and supporting structures: Secondary | ICD-10-CM

## 2012-04-29 DIAGNOSIS — K089 Disorder of teeth and supporting structures, unspecified: Secondary | ICD-10-CM | POA: Insufficient documentation

## 2012-04-29 MED ORDER — AMOXICILLIN 500 MG PO CAPS: 500.0000 mg | ORAL_CAPSULE | Freq: Three times a day (TID) | ORAL | Status: DC

## 2012-04-29 MED ORDER — HYDROCODONE-ACETAMINOPHEN 5-325 MG PO TABS: ORAL_TABLET | ORAL | Status: DC
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
## 2012-04-29 NOTE — ED Notes (Signed)
Dental pain lt mandibular molar 

## 2012-05-03 NOTE — ED Provider Notes (Signed)
Medical screening examination/treatment/procedure(s) were performed by non-physician practitioner and as supervising physician I was immediately available for consultation/collaboration.  Ronan Duecker, MD 05/03/12 0618 

## 2012-05-22 ENCOUNTER — Emergency Department (HOSPITAL_COMMUNITY): Payer: Self-pay

## 2012-05-22 ENCOUNTER — Emergency Department (HOSPITAL_COMMUNITY)
Admission: EM | Admit: 2012-05-22 | Discharge: 2012-05-22 | Disposition: A | Discharge status: Home / Self Care | Payer: Self-pay | Attending: Emergency Medicine | Admitting: Emergency Medicine

## 2012-05-22 ENCOUNTER — Encounter (HOSPITAL_COMMUNITY): Payer: Self-pay | Admitting: *Deleted

## 2012-05-22 DIAGNOSIS — B349 Viral infection, unspecified: Secondary | ICD-10-CM

## 2012-05-22 DIAGNOSIS — J069 Acute upper respiratory infection, unspecified: Secondary | ICD-10-CM | POA: Insufficient documentation

## 2012-05-22 DIAGNOSIS — R059 Cough, unspecified: Secondary | ICD-10-CM | POA: Insufficient documentation

## 2012-05-22 DIAGNOSIS — Z87828 Personal history of other (healed) physical injury and trauma: Secondary | ICD-10-CM | POA: Insufficient documentation

## 2012-05-22 DIAGNOSIS — R52 Pain, unspecified: Secondary | ICD-10-CM | POA: Insufficient documentation

## 2012-05-22 DIAGNOSIS — F17200 Nicotine dependence, unspecified, uncomplicated: Secondary | ICD-10-CM | POA: Insufficient documentation

## 2012-05-22 DIAGNOSIS — R05 Cough: Secondary | ICD-10-CM | POA: Insufficient documentation

## 2012-05-22 DIAGNOSIS — B9789 Other viral agents as the cause of diseases classified elsewhere: Secondary | ICD-10-CM | POA: Insufficient documentation

## 2012-05-22 MED ORDER — BENZONATATE 100 MG PO CAPS: 100.0000 mg | ORAL_CAPSULE | Freq: Three times a day (TID) | ORAL | Status: DC | PRN
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
## 2012-05-22 NOTE — ED Notes (Signed)
Pt c/o cough, fever and congestion x 3 days, cough productive per pt, body aches and HA

## 2012-05-22 NOTE — ED Notes (Signed)
Called to tx area. No response. Pt not in wr

## 2012-05-22 NOTE — ED Notes (Signed)
MD at bedside. 

## 2014-03-30 ENCOUNTER — Encounter (HOSPITAL_COMMUNITY): Payer: Self-pay | Admitting: *Deleted

## 2015-09-30 ENCOUNTER — Emergency Department (HOSPITAL_COMMUNITY)
Admission: EM | Admit: 2015-09-30 | Discharge: 2015-09-30 | Disposition: A | Discharge status: Home / Self Care | Payer: Medicaid Other | Attending: Emergency Medicine | Admitting: Emergency Medicine

## 2015-09-30 ENCOUNTER — Encounter (HOSPITAL_COMMUNITY): Payer: Self-pay | Admitting: Emergency Medicine

## 2015-09-30 DIAGNOSIS — F17210 Nicotine dependence, cigarettes, uncomplicated: Secondary | ICD-10-CM | POA: Insufficient documentation

## 2015-09-30 DIAGNOSIS — E0590 Thyrotoxicosis, unspecified without thyrotoxic crisis or storm: Secondary | ICD-10-CM | POA: Insufficient documentation

## 2015-09-30 DIAGNOSIS — Z760 Encounter for issue of repeat prescription: Secondary | ICD-10-CM

## 2015-09-30 HISTORY — DX: Thyrotoxicosis, unspecified without thyrotoxic crisis or storm: E05.90

## 2015-09-30 LAB — POC URINE PREG, ED: Preg Test, Ur: NEGATIVE

## 2015-09-30 MED ORDER — METHIMAZOLE 10 MG PO TABS: 10.0000 mg | ORAL_TABLET | Freq: Two times a day (BID) | ORAL | Status: DC
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
## 2015-09-30 NOTE — ED Notes (Signed)
Pt states she has hyperthyroid, ran out of medication after taking this mornings dose, has appointment with PCP in 3 weeks but needs medication to get her to her visit

## 2015-09-30 NOTE — Discharge Instructions (Signed)
Medicine Refill at the Emergency Department  We have refilled your medicine today, but it is best for you to get refills through your primary health care provider's office. In the future, please plan ahead so you do not need to get refills from the emergency department.  If the medicine we refilled was a maintenance medicine, you may have received only enough to get you by until you are able to see your regular health care provider.     This information is not intended to replace advice given to you by your health care provider. Make sure you discuss any questions you have with your health care provider.     Document Released: 09/01/2003 Document Revised: 06/05/2014 Document Reviewed: 08/22/2013  Elsevier Interactive Patient Education 2016 [employer]

## 2015-11-29 ENCOUNTER — Emergency Department (HOSPITAL_COMMUNITY)
Admission: EM | Admit: 2015-11-29 | Discharge: 2015-11-29 | Disposition: A | Discharge status: Home / Self Care | Payer: Self-pay | Attending: Emergency Medicine | Admitting: Emergency Medicine

## 2015-11-29 ENCOUNTER — Encounter (HOSPITAL_COMMUNITY): Payer: Self-pay | Admitting: *Deleted

## 2015-11-29 ENCOUNTER — Emergency Department (HOSPITAL_COMMUNITY): Payer: Self-pay

## 2015-11-29 DIAGNOSIS — M549 Dorsalgia, unspecified: Secondary | ICD-10-CM

## 2015-11-29 DIAGNOSIS — W1839XA Other fall on same level, initial encounter: Secondary | ICD-10-CM | POA: Insufficient documentation

## 2015-11-29 DIAGNOSIS — F17210 Nicotine dependence, cigarettes, uncomplicated: Secondary | ICD-10-CM | POA: Insufficient documentation

## 2015-11-29 DIAGNOSIS — Y999 Unspecified external cause status: Secondary | ICD-10-CM | POA: Insufficient documentation

## 2015-11-29 DIAGNOSIS — Z791 Long term (current) use of non-steroidal anti-inflammatories (NSAID): Secondary | ICD-10-CM | POA: Insufficient documentation

## 2015-11-29 DIAGNOSIS — Y939 Activity, unspecified: Secondary | ICD-10-CM | POA: Insufficient documentation

## 2015-11-29 DIAGNOSIS — M545 Low back pain, unspecified: Secondary | ICD-10-CM

## 2015-11-29 DIAGNOSIS — M25552 Pain in left hip: Secondary | ICD-10-CM | POA: Insufficient documentation

## 2015-11-29 DIAGNOSIS — Z79899 Other long term (current) drug therapy: Secondary | ICD-10-CM | POA: Insufficient documentation

## 2015-11-29 DIAGNOSIS — W19XXXA Unspecified fall, initial encounter: Secondary | ICD-10-CM

## 2015-11-29 DIAGNOSIS — Y92000 Kitchen of unspecified non-institutional (private) residence as  the place of occurrence of the external cause: Secondary | ICD-10-CM | POA: Insufficient documentation

## 2015-11-29 DIAGNOSIS — G8929 Other chronic pain: Secondary | ICD-10-CM | POA: Insufficient documentation

## 2015-11-29 HISTORY — DX: Dorsalgia, unspecified: M54.9

## 2015-11-29 MED ORDER — HYDROCODONE-ACETAMINOPHEN 5-325 MG PO TABS: 1.0000 | ORAL_TABLET | Freq: Four times a day (QID) | ORAL | Status: DC | PRN

## 2015-11-29 MED ORDER — HYDROMORPHONE HCL 2 MG/ML IJ SOLN
2.0000 mg | Freq: Once | INTRAMUSCULAR | Status: AC
  Administered 2015-11-29: 2 mg via INTRAMUSCULAR
  Filled 2015-11-29: qty 1

## 2015-11-29 MED ORDER — CYCLOBENZAPRINE HCL 10 MG PO TABS: 10.0000 mg | ORAL_TABLET | Freq: Two times a day (BID) | ORAL | Status: DC | PRN

## 2015-11-29 MED ORDER — NAPROXEN 500 MG PO TABS: 500.0000 mg | ORAL_TABLET | Freq: Two times a day (BID) | ORAL | Status: DC

## 2015-11-29 NOTE — ED Notes (Signed)
Patient verbalizes understanding of discharge instructions, prescriptions, home care and follow up care. Patient out of department at this time with family. 

## 2015-11-29 NOTE — Discharge Instructions (Signed)
Take medications as directed. Rest as much possible. Return for any new or worse symptoms.
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
## 2015-11-29 NOTE — ED Notes (Signed)
Patient ambulatory to restroom, steady gait-no deficit noted.

## 2015-11-29 NOTE — ED Notes (Signed)
Pt comes in with chronic back pain that worsened several days ago after a fall. Pt states she has used tylenol and ibuprofen with no relief.

## 2016-06-15 ENCOUNTER — Encounter (HOSPITAL_COMMUNITY): Payer: Self-pay | Admitting: Emergency Medicine

## 2016-06-15 ENCOUNTER — Emergency Department (HOSPITAL_COMMUNITY): Payer: Self-pay

## 2016-06-15 ENCOUNTER — Emergency Department (HOSPITAL_COMMUNITY)
Admission: EM | Admit: 2016-06-15 | Discharge: 2016-06-15 | Disposition: A | Discharge status: Home / Self Care | Payer: Self-pay | Attending: Emergency Medicine | Admitting: Emergency Medicine

## 2016-06-15 DIAGNOSIS — Y929 Unspecified place or not applicable: Secondary | ICD-10-CM | POA: Insufficient documentation

## 2016-06-15 DIAGNOSIS — W208XXA Other cause of strike by thrown, projected or falling object, initial encounter: Secondary | ICD-10-CM | POA: Insufficient documentation

## 2016-06-15 DIAGNOSIS — F17210 Nicotine dependence, cigarettes, uncomplicated: Secondary | ICD-10-CM | POA: Insufficient documentation

## 2016-06-15 DIAGNOSIS — M549 Dorsalgia, unspecified: Secondary | ICD-10-CM | POA: Insufficient documentation

## 2016-06-15 DIAGNOSIS — Z79899 Other long term (current) drug therapy: Secondary | ICD-10-CM | POA: Insufficient documentation

## 2016-06-15 DIAGNOSIS — S9031XA Contusion of right foot, initial encounter: Secondary | ICD-10-CM | POA: Insufficient documentation

## 2016-06-15 DIAGNOSIS — Y939 Activity, unspecified: Secondary | ICD-10-CM | POA: Insufficient documentation

## 2016-06-15 DIAGNOSIS — Y999 Unspecified external cause status: Secondary | ICD-10-CM | POA: Insufficient documentation

## 2016-06-15 MED ORDER — IBUPROFEN 600 MG PO TABS: 600.0000 mg | ORAL_TABLET | Freq: Four times a day (QID) | ORAL | 0 refills | Status: DC | PRN

## 2016-06-15 MED ORDER — TRAMADOL HCL 50 MG PO TABS
100.0000 mg | ORAL_TABLET | Freq: Once | ORAL | Status: AC
  Administered 2016-06-15: 100 mg via ORAL
  Filled 2016-06-15: qty 2

## 2016-06-15 MED ORDER — IBUPROFEN 800 MG PO TABS
800.0000 mg | ORAL_TABLET | Freq: Once | ORAL | Status: DC
Start: 2016-06-15 — End: 2016-06-16
  Filled 2016-06-15: qty 1

## 2016-06-15 MED ORDER — BACITRACIN-NEOMYCIN-POLYMYXIN 400-5-5000 EX OINT
TOPICAL_OINTMENT | Freq: Once | CUTANEOUS | Status: AC
  Administered 2016-06-15: 1 via TOPICAL
  Filled 2016-06-15: qty 1

## 2016-06-15 MED ORDER — PROMETHAZINE HCL 12.5 MG PO TABS
12.5000 mg | ORAL_TABLET | Freq: Once | ORAL | Status: DC
  Filled 2016-06-15: qty 1

## 2016-06-15 MED ORDER — TRAMADOL HCL 50 MG PO TABS: ORAL_TABLET | ORAL | 0 refills | Status: DC

## 2016-06-15 NOTE — Discharge Instructions (Signed)
Your examination suggest a contusion of the right foot. Your x-ray is negative for fracture or dislocation. You have an abrasion on your foot. Your tetanus has been updated today. Please update your medical records to indicate the tetanus change. Please elevate your foot above your waist is much as possible. Apply ice when possible. Use the postoperative shoe until you can safely usual regular shoes. Use caution getting around. Use ibuprofen every 6 hours. Use ultram for more severe pain.

## 2016-06-15 NOTE — ED Triage Notes (Signed)
Pt c/o right great toe/foot pain since dropping a glass dish on top of it. Pt has abrasion to the right great toe.
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
## 2016-06-15 NOTE — ED Notes (Signed)
Pt left without her discharge instructions. Pt was not happy with the medications she received.

## 2016-11-14 ENCOUNTER — Emergency Department (HOSPITAL_COMMUNITY)
Admission: EM | Admit: 2016-11-14 | Discharge: 2016-11-14 | Disposition: A | Discharge status: Home / Self Care | Payer: No Typology Code available for payment source | Attending: Emergency Medicine | Admitting: Emergency Medicine

## 2016-11-14 ENCOUNTER — Encounter (HOSPITAL_COMMUNITY): Payer: Self-pay

## 2016-11-14 ENCOUNTER — Emergency Department (HOSPITAL_COMMUNITY): Payer: No Typology Code available for payment source

## 2016-11-14 DIAGNOSIS — Y939 Activity, unspecified: Secondary | ICD-10-CM | POA: Insufficient documentation

## 2016-11-14 DIAGNOSIS — M545 Low back pain, unspecified: Secondary | ICD-10-CM

## 2016-11-14 DIAGNOSIS — E0590 Thyrotoxicosis, unspecified without thyrotoxic crisis or storm: Secondary | ICD-10-CM | POA: Insufficient documentation

## 2016-11-14 DIAGNOSIS — Y999 Unspecified external cause status: Secondary | ICD-10-CM | POA: Diagnosis not present

## 2016-11-14 DIAGNOSIS — Y92410 Unspecified street and highway as the place of occurrence of the external cause: Secondary | ICD-10-CM | POA: Diagnosis not present

## 2016-11-14 DIAGNOSIS — Z79899 Other long term (current) drug therapy: Secondary | ICD-10-CM | POA: Diagnosis not present

## 2016-11-14 LAB — PREGNANCY, URINE: Preg Test, Ur: NEGATIVE

## 2016-11-14 MED ORDER — IBUPROFEN 800 MG PO TABS: 800.0000 mg | ORAL_TABLET | Freq: Three times a day (TID) | ORAL | 0 refills | Status: AC

## 2016-11-14 MED ORDER — HYDROCODONE-ACETAMINOPHEN 5-325 MG PO TABS
1.0000 | ORAL_TABLET | Freq: Once | ORAL | Status: AC
  Administered 2016-11-14: 1 via ORAL
  Filled 2016-11-14: qty 1

## 2016-11-14 MED ORDER — HYDROCODONE-ACETAMINOPHEN 5-325 MG PO TABS: 2.0000 | ORAL_TABLET | ORAL | 0 refills | Status: DC | PRN

## 2016-11-14 MED ORDER — METHOCARBAMOL 500 MG PO TABS: 500.0000 mg | ORAL_TABLET | Freq: Two times a day (BID) | ORAL | 0 refills | Status: AC

## 2016-11-14 NOTE — Discharge Instructions (Signed)
Take pain medication as needed. Take ibuprofen scheduled for the next 3-4 days as directed. Take muscle relaxer as indicated. Follow-up with PCP for further evaluation. Return to ED for worsening pain, additional injury, numbness, weakness, trouble walking, headache, vision changes.
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
## 2016-11-14 NOTE — ED Notes (Signed)
Pt states understanding of care given and follow up instructions.  Pt a/o, ambulated from ED with slight limp.  Pt reminded to not drive or drink ETOH while on narcotic pain medications
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
## 2018-04-06 ENCOUNTER — Encounter (HOSPITAL_COMMUNITY): Payer: Self-pay | Admitting: Emergency Medicine

## 2018-04-06 ENCOUNTER — Other Ambulatory Visit: Payer: Self-pay

## 2018-04-06 ENCOUNTER — Emergency Department (HOSPITAL_COMMUNITY)
Admission: EM | Admit: 2018-04-06 | Discharge: 2018-04-06 | Disposition: A | Discharge status: Home / Self Care | Payer: Self-pay | Attending: Emergency Medicine | Admitting: Emergency Medicine

## 2018-04-06 ENCOUNTER — Emergency Department (HOSPITAL_COMMUNITY): Payer: Self-pay

## 2018-04-06 DIAGNOSIS — F17210 Nicotine dependence, cigarettes, uncomplicated: Secondary | ICD-10-CM | POA: Insufficient documentation

## 2018-04-06 DIAGNOSIS — Z79899 Other long term (current) drug therapy: Secondary | ICD-10-CM | POA: Insufficient documentation

## 2018-04-06 DIAGNOSIS — J039 Acute tonsillitis, unspecified: Secondary | ICD-10-CM

## 2018-04-06 DIAGNOSIS — R05 Cough: Secondary | ICD-10-CM

## 2018-04-06 DIAGNOSIS — R059 Cough, unspecified: Secondary | ICD-10-CM

## 2018-04-06 MED ORDER — AZITHROMYCIN 250 MG PO TABS: ORAL_TABLET | ORAL | 0 refills | Status: DC

## 2018-04-06 MED ORDER — MAGIC MOUTHWASH W/LIDOCAINE: 5.0000 mL | Freq: Three times a day (TID) | ORAL | 0 refills | Status: AC | PRN

## 2018-04-06 MED ORDER — BENZONATATE 200 MG PO CAPS: 200.0000 mg | ORAL_CAPSULE | Freq: Three times a day (TID) | ORAL | 0 refills | Status: AC | PRN

## 2018-04-06 NOTE — ED Triage Notes (Signed)
Pt reports productive cough x 1 week and sore throat. Chest discomfort with coughing.

## 2018-04-06 NOTE — Discharge Instructions (Signed)
Drink plenty of fluids.  Alternate Tylenol and ibuprofen every 4 and 6 hours as needed for pain or fever.  Use your albuterol inhaler 1 to 2 puffs every 4-6 hours as needed.  Follow-up with your primary care provider or return to the ER for any worsening symptoms.
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
## 2020-09-20 ENCOUNTER — Other Ambulatory Visit: Payer: Self-pay

## 2020-09-20 DIAGNOSIS — K029 Dental caries, unspecified: Secondary | ICD-10-CM | POA: Insufficient documentation

## 2020-09-20 DIAGNOSIS — F17210 Nicotine dependence, cigarettes, uncomplicated: Secondary | ICD-10-CM | POA: Insufficient documentation

## 2020-09-20 NOTE — ED Triage Notes (Signed)
Pt here from home with cc of bottom right dental pain that she thinks is starting to abscess. Has happened before and would like abx.

## 2020-09-21 ENCOUNTER — Emergency Department (HOSPITAL_COMMUNITY)
Admission: EM | Admit: 2020-09-21 | Discharge: 2020-09-21 | Disposition: A | Discharge status: Home / Self Care | Payer: Self-pay | Attending: Emergency Medicine | Admitting: Emergency Medicine

## 2020-09-21 DIAGNOSIS — K029 Dental caries, unspecified: Secondary | ICD-10-CM

## 2020-09-21 MED ORDER — AMOXICILLIN 250 MG PO CAPS
500.0000 mg | ORAL_CAPSULE | Freq: Once | ORAL | Status: AC
  Administered 2020-09-21: 500 mg via ORAL
  Filled 2020-09-21: qty 2

## 2020-09-21 MED ORDER — OXYCODONE-ACETAMINOPHEN 5-325 MG PO TABS: 1.0000 | ORAL_TABLET | Freq: Four times a day (QID) | ORAL | 0 refills | Status: DC | PRN

## 2020-09-21 MED ORDER — AMOXICILLIN 500 MG PO CAPS: 500.0000 mg | ORAL_CAPSULE | Freq: Three times a day (TID) | ORAL | 0 refills | Status: DC

## 2020-09-21 MED ORDER — BUPIVACAINE-EPINEPHRINE (PF) 0.5% -1:200000 IJ SOLN
1.8000 mL | Freq: Once | INTRAMUSCULAR | Status: AC
  Administered 2020-09-21: 1.8 mL
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
## 2020-09-21 NOTE — ED Notes (Signed)
Gave patient resources for free dental clinic. Pt also rec'd d/c papers. Ambulatory to lobby

## 2020-12-05 ENCOUNTER — Other Ambulatory Visit: Payer: Self-pay

## 2020-12-05 ENCOUNTER — Emergency Department (HOSPITAL_COMMUNITY)
Admission: EM | Admit: 2020-12-05 | Discharge: 2020-12-06 | Disposition: A | Discharge status: Home / Self Care | Payer: Self-pay | Attending: Emergency Medicine | Admitting: Emergency Medicine

## 2020-12-05 DIAGNOSIS — K047 Periapical abscess without sinus: Secondary | ICD-10-CM | POA: Insufficient documentation

## 2020-12-05 DIAGNOSIS — F17210 Nicotine dependence, cigarettes, uncomplicated: Secondary | ICD-10-CM | POA: Insufficient documentation

## 2020-12-05 DIAGNOSIS — K029 Dental caries, unspecified: Secondary | ICD-10-CM | POA: Insufficient documentation
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
## 2020-12-05 NOTE — ED Triage Notes (Signed)
Bilateral lower dental pain with lower jaw swelling x 1 day. Last Advil 400mg  at 1600.

## 2020-12-06 MED ORDER — CLINDAMYCIN HCL 300 MG PO CAPS: 300.0000 mg | ORAL_CAPSULE | Freq: Three times a day (TID) | ORAL | 0 refills | Status: AC

## 2020-12-06 MED ORDER — OXYCODONE-ACETAMINOPHEN 5-325 MG PO TABS: 1.0000 | ORAL_TABLET | Freq: Four times a day (QID) | ORAL | 0 refills | Status: AC | PRN

## 2020-12-06 NOTE — Discharge Instructions (Addendum)
Call your primary care doctor or specialist as discussed in the next 2-3 days.   Return immediately back to the ER if:  Your symptoms worsen within the next 12-24 hours. You develop new symptoms such as new fevers, persistent vomiting, new pain, shortness of breath, or new weakness or numbness, or if you have any other concerns.  

## 2024-07-01 ENCOUNTER — Other Ambulatory Visit (HOSPITAL_COMMUNITY): Payer: Self-pay | Admitting: Internal Medicine

## 2024-07-01 DIAGNOSIS — E049 Nontoxic goiter, unspecified: Secondary | ICD-10-CM

## 2024-07-04 NOTE — Progress Notes (Unsigned)
 Rhonda Macias LABOR, MD  [name] PROCEDURE / BIOPSY REVIEW Date: 07/04/24  Requested Biopsy site: thyroid nodule Reason for request: left thyroid nodule lower lobe lesion Imaging review: Best seen on US   Decision: Approved Imaging modality to perform: Ultrasound Schedule with: No sedation / Local anesthetic Schedule for: Any VIR  Additional comments: @Schedulers .  Please contact me with questions, concerns, or if issue pertaining to this request arise.  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

## 2024-07-11 ENCOUNTER — Ambulatory Visit (HOSPITAL_COMMUNITY): Admission: RE | Admit: 2024-07-11 | Source: Ambulatory Visit
